# Patient Record
Sex: Male | Born: 1975 | Race: Black or African American | Hispanic: No | Marital: Single | Smoking: Current some day smoker
Health system: Southern US, Community
[De-identification: ages and names within clinical notes are randomized; demographics above are authoritative.]

## PROBLEM LIST (undated history)

## (undated) DIAGNOSIS — R002 Palpitations: Secondary | ICD-10-CM

## (undated) DIAGNOSIS — I471 Supraventricular tachycardia: Secondary | ICD-10-CM

## (undated) DIAGNOSIS — F419 Anxiety disorder, unspecified: Secondary | ICD-10-CM

## (undated) HISTORY — DX: Anxiety disorder, unspecified: F41.9

---

## 1999-12-12 HISTORY — PX: FRACTURE SURGERY: SHX138

## 1999-12-12 HISTORY — PX: BONY PELVIS SURGERY: SHX572

## 2003-12-15 ENCOUNTER — Emergency Department (HOSPITAL_COMMUNITY): Admission: EM | Admit: 2003-12-15 | Discharge: 2003-12-15 | Payer: Self-pay | Admitting: *Deleted

## 2004-08-08 ENCOUNTER — Emergency Department (HOSPITAL_COMMUNITY): Admission: EM | Admit: 2004-08-08 | Discharge: 2004-08-08 | Payer: Self-pay | Admitting: Emergency Medicine

## 2006-05-04 ENCOUNTER — Emergency Department (HOSPITAL_COMMUNITY): Admission: EM | Admit: 2006-05-04 | Discharge: 2006-05-05 | Payer: Self-pay | Admitting: Emergency Medicine

## 2011-01-13 ENCOUNTER — Inpatient Hospital Stay (INDEPENDENT_AMBULATORY_CARE_PROVIDER_SITE_OTHER)
Admission: RE | Admit: 2011-01-13 | Discharge: 2011-01-13 | Disposition: A | Discharge status: Home / Self Care | Payer: Self-pay | Source: Ambulatory Visit | Attending: Family Medicine | Admitting: Family Medicine

## 2011-01-13 DIAGNOSIS — M7989 Other specified soft tissue disorders: Secondary | ICD-10-CM

## 2011-01-13 LAB — POCT URINALYSIS DIPSTICK
Ketones, ur: NEGATIVE mg/dL
Urine Glucose, Fasting: NEGATIVE mg/dL

## 2011-01-20 ENCOUNTER — Encounter: Payer: Self-pay | Admitting: Family Medicine

## 2011-01-20 ENCOUNTER — Ambulatory Visit (INDEPENDENT_AMBULATORY_CARE_PROVIDER_SITE_OTHER): Payer: 59 | Admitting: Family Medicine

## 2011-01-20 VITALS — BP 121/77 | HR 86 | Temp 98.5°F | Ht 67.0 in | Wt 176.0 lb

## 2011-01-20 DIAGNOSIS — Z0000 Encounter for general adult medical examination without abnormal findings: Secondary | ICD-10-CM

## 2011-01-20 DIAGNOSIS — F17200 Nicotine dependence, unspecified, uncomplicated: Secondary | ICD-10-CM

## 2011-01-20 NOTE — Assessment & Plan Note (Addendum)
On day 2 of chantix. Encouraged use and instructed pt to cal [place] with any concerns or if any other additional were needed for smoking cessation. Will followup in 3-6 months as to resolution of smoking.  Pt agreeable to plan.

## 2011-01-22 ENCOUNTER — Encounter: Payer: Self-pay | Admitting: Family Medicine

## 2011-01-22 DIAGNOSIS — Z0000 Encounter for general adult medical examination without abnormal findings: Secondary | ICD-10-CM | POA: Insufficient documentation

## 2011-01-22 NOTE — Assessment & Plan Note (Signed)
Otherwise normal annual physical exam. Will follow-up in 1 year. Will see back in 3-6 months about smoking.

## 2011-01-22 NOTE — Progress Notes (Signed)
  Subjective:    Patient ID: Gary Suarez, male    DOB: [birth year], 35 y.o.   MRN: [id number]  HPI  Pt here for PCP set up. Pt denies any acute issues.   Review of Systems  Constitutional: Negative.   Respiratory: Negative.   Cardiovascular: Negative.   Genitourinary: Negative.    Social Hx: Single. Involved in monogamous relationship. Works at [employer] at [employer]. (Couldnt fully insert social history into epic).     Objective:   Physical Exam  Vitals reviewed. Constitutional: He is oriented to person, place, and time. He appears well-developed and well-nourished.  HENT:  Head: Normocephalic and atraumatic.  Eyes: EOM are normal. Pupils are equal, round, and reactive to light.  Neck: Normal range of motion. Neck supple.  Cardiovascular: Normal rate, regular rhythm and normal heart sounds.   Pulmonary/Chest: Effort normal and breath sounds normal. He has no wheezes.  Abdominal: Soft. Bowel sounds are normal.  Musculoskeletal: Normal range of motion.  Neurological: He is alert and oriented to person, place, and time. He has normal reflexes.  Skin: Skin is warm and dry.  Psychiatric: He has a normal mood and affect. His behavior is normal.          Assessment & Plan:

## 2011-03-22 ENCOUNTER — Inpatient Hospital Stay (INDEPENDENT_AMBULATORY_CARE_PROVIDER_SITE_OTHER)
Admission: RE | Admit: 2011-03-22 | Discharge: 2011-03-22 | Disposition: A | Discharge status: Home / Self Care | Payer: 59 | Source: Ambulatory Visit | Attending: Emergency Medicine | Admitting: Emergency Medicine

## 2011-03-22 DIAGNOSIS — B350 Tinea barbae and tinea capitis: Secondary | ICD-10-CM

## 2014-01-01 ENCOUNTER — Encounter (HOSPITAL_COMMUNITY): Payer: Self-pay | Admitting: Emergency Medicine

## 2014-01-01 ENCOUNTER — Emergency Department (HOSPITAL_COMMUNITY)
Admission: EM | Admit: 2014-01-01 | Discharge: 2014-01-02 | Disposition: A | Discharge status: Home / Self Care | Payer: BC Managed Care – PPO | Attending: Emergency Medicine | Admitting: Emergency Medicine

## 2014-01-01 DIAGNOSIS — F17200 Nicotine dependence, unspecified, uncomplicated: Secondary | ICD-10-CM | POA: Insufficient documentation

## 2014-01-01 DIAGNOSIS — Z87828 Personal history of other (healed) physical injury and trauma: Secondary | ICD-10-CM | POA: Insufficient documentation

## 2014-01-01 DIAGNOSIS — I471 Supraventricular tachycardia: Secondary | ICD-10-CM

## 2014-01-01 DIAGNOSIS — Z79899 Other long term (current) drug therapy: Secondary | ICD-10-CM | POA: Insufficient documentation

## 2014-01-01 DIAGNOSIS — I498 Other specified cardiac arrhythmias: Secondary | ICD-10-CM | POA: Insufficient documentation

## 2014-01-01 DIAGNOSIS — Z8659 Personal history of other mental and behavioral disorders: Secondary | ICD-10-CM | POA: Insufficient documentation

## 2014-01-01 LAB — CBC WITH DIFFERENTIAL/PLATELET
BASOS ABS: 0 10*3/uL (ref 0.0–0.1)
Basophils Relative: 0 % (ref 0–1)
EOS PCT: 1 % (ref 0–5)
Eosinophils Absolute: 0.1 10*3/uL (ref 0.0–0.7)
HCT: 44 % (ref 39.0–52.0)
Hemoglobin: 15.5 g/dL (ref 13.0–17.0)
LYMPHS ABS: 4.5 10*3/uL — AB (ref 0.7–4.0)
LYMPHS PCT: 51 % — AB (ref 12–46)
MCH: 29.9 pg (ref 26.0–34.0)
MCHC: 35.2 g/dL (ref 30.0–36.0)
MCV: 84.8 fL (ref 78.0–100.0)
Monocytes Absolute: 0.8 10*3/uL (ref 0.1–1.0)
Monocytes Relative: 9 % (ref 3–12)
Neutro Abs: 3.4 10*3/uL (ref 1.7–7.7)
Neutrophils Relative %: 39 % — ABNORMAL LOW (ref 43–77)
Platelets: 217 10*3/uL (ref 150–400)
RBC: 5.19 MIL/uL (ref 4.22–5.81)
RDW: 13.6 % (ref 11.5–15.5)
WBC: 8.8 10*3/uL (ref 4.0–10.5)

## 2014-01-01 LAB — BASIC METABOLIC PANEL
BUN: 18 mg/dL (ref 6–23)
CO2: 19 meq/L (ref 19–32)
Calcium: 8.9 mg/dL (ref 8.4–10.5)
Chloride: 96 mEq/L (ref 96–112)
Creatinine, Ser: 1.11 mg/dL (ref 0.50–1.35)
GFR calc Af Amer: >90 mL/min (ref >90)
GFR calc non Af Amer: 84 mL/min — ABNORMAL LOW (ref >90)
Glucose, Bld: 133 mg/dL — ABNORMAL HIGH (ref 70–99)
POTASSIUM: 3.4 meq/L — AB (ref 3.7–5.3)
SODIUM: 135 meq/L — AB (ref 137–147)

## 2014-01-01 MED ORDER — ADENOSINE 6 MG/2ML IV SOLN
6.0000 mg | Freq: Once | INTRAVENOUS | Status: AC
  Administered 2014-01-01: 6 mg via INTRAVENOUS

## 2014-01-01 MED ORDER — SODIUM CHLORIDE 0.9 % IV BOLUS (SEPSIS)
1000.0000 mL | Freq: Once | INTRAVENOUS | Status: AC
  Administered 2014-01-01: 1000 mL via INTRAVENOUS

## 2014-01-01 MED ORDER — ADENOSINE 6 MG/2ML IV SOLN
INTRAVENOUS | Status: AC
  Filled 2014-01-01: qty 6
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
## 2014-01-01 NOTE — ED Notes (Signed)
Patient took Herbalite, has been taking it since Sunday.  Patient with palpitations at this time.  Patient did have alcohol last night.  Patient with HR of 207.

## 2014-01-01 NOTE — ED Notes (Addendum)
Pt brought back to room from triage. Placed on pacing pads and on EKG monitoring. Dr. Bernette MayersSheldon, Elliot GurneyWoody RN, Minerva AreolaEric RN and this RN at bedside. This Rn attempted IV access x 2 without success. Woody Rn placed 18G in Right AC. Pt HR 200's, able to speak full sentences, breathing unlabored. Pt given 6mg  of adenosine by Elliot GurneyWoody RN and Minerva AreolaEric RN flushed at top port immediately after, and arm lifted. HR decreased slowly and staying in 110-120bpm, pt converted to sinus rhythm. Pt sts feels better. Denies palpitations at this time. Tyrone Phlebotomy sticking pt at bedside.    Pt started Herbal life on Sunday, drinks appx.3-4 Sodas a day and a few beers.

## 2014-01-02 NOTE — Discharge Instructions (Signed)
Supraventricular Tachycardia °Supraventricular tachycardia (SVT) is an abnormal heart rhythm (arrhythmia) that causes the heart to beat very fast (tachycardia). This kind of fast heartbeat originates in the upper chambers of the heart (atria). SVT can cause the heart to beat greater than 100 beats per minute. SVT can have a rapid burst of heartbeats. This can start and stop suddenly without warning and is called nonsustained. SVT can also be sustained, in which the heart beats at a continuous fast rate.  °CAUSES  °There can be different causes of SVT. Some of these include: °· Heart valve problems such as mitral valve prolapse. °· An enlarged heart (hypertrophic cardiomyopathy). °· Congenital heart problems. °· Heart inflammation (pericarditis). °· Hyperthyroidism. °· Low potassium or magnesium levels. °· Caffeine. °· Drug use such as cocaine, methamphetamines, or stimulants. °· Some over-the-counter medicines such as: °· Decongestants. °· Diet medicines. °· Herbal medicines. °SYMPTOMS  °Symptoms of SVT can vary. Symptoms depend on whether the SVT is sustained or nonsustained. You may experience: °· No symptoms (asymptomatic). °· An awareness of your heart beating rapidly (palpitations). °· Shortness of breath. °· Chest pain or pressure. °If your blood pressure drops because of the SVT, you may experience: °· Fainting or near fainting. °· Weakness. °· Dizziness. °DIAGNOSIS  °Different tests can be performed to diagnose SVT, such as: °· An electrocardiogram (EKG). This is a painless test that records the electrical activity of your heart. °· Holter monitor. This is a 24 hour recording of your heart rhythm. You will be given a diary. Write down all symptoms that you have and what you were doing at the time you experienced symptoms. °· Arrhythmia monitor. This is a small device that your wear for several weeks. It records the heart rhythm when you have symptoms. °· Echocardiogram. This is an imaging test to help detect  abnormal heart structure such as congenital abnormalities, heart valve problems, or heart enlargement. °· Stress test. This test can help determine if the SVT is related to exercise. °· Electrophysiology study (EPS). This is a procedure that evaluates your heart's electrical system and can help your caregiver find the cause of your SVT. °TREATMENT  °Treatment of SVT depends on the symptoms, how often it recurs, and whether there are any underlying heart problems.  °· If symptoms are rare and no other cardiac disease is present, no treatment may be needed. °· Blood work may be done to check potassium, magnesium, and thyroid hormone levels to see if they are abnormal. If these levels are abnormal, treatment to correct the problems will occur. °Medicines °Your caregiver may use oral medicines to treat SVT. These medicines are given for long-term control of SVT. Medicines may be used alone or in combination with other treatments. These medicines work to slow nerve impulses in the heart muscle. These medicines can also be used to treat high blood pressure. Some of these medicines may include: °· Calcium channel blockers. °· Beta blockers. °· Digoxin. °Nonsurgical procedures °Nonsurgical techniques may be used if oral medicines do not work. Some examples include: °· Cardioversion. This technique uses either drugs or an electrical shock to restore a normal heart rhythm. °· Cardioversion drugs may be given through an intravenous (IV) line to help "reset" the heart rhythm. °· In electrical cardioversion, the caregiver shocks your heart to stop its beat for a split second. This helps to reset the heart to a normal rhythm. °· Ablation. This procedure is done under mild sedation. High frequency radio wave energy is used to   destroy the area of heart tissue responsible for the SVT. °HOME CARE INSTRUCTIONS  °· Do not smoke. °· Only take medicines prescribed by your caregiver. Check with your caregiver before using over-the-counter  medicines. °· Check with your caregiver about how much alcohol and caffeine (coffee, tea, colas, or chocolate) you may have. °· It is very important to keep all follow-up referrals and appointments in order to properly manage this problem. °SEEK IMMEDIATE MEDICAL CARE IF: °· You have dizziness. °· You faint or nearly faint. °· You have shortness of breath. °· You have chest pain or pressure. °· You have sudden nausea or vomiting. °· You have profuse sweating. °· You are concerned about how long your symptoms last. °· You are concerned about the frequency of your SVT episodes. °If you have the above symptoms, call your local emergency services (911 in [place] immediately. Do not drive yourself to the hospital. °MAKE SURE YOU:  °· Understand these instructions. °· Will watch your condition. °· Will get help right away if you are not doing well or get worse. °Document Released: 11/27/2005 Document Revised: 02/19/2012 Document Reviewed: 03/11/2009 °ExitCare® Patient Information ©2014 [employer]. ° °

## 2014-01-15 ENCOUNTER — Emergency Department (HOSPITAL_COMMUNITY)
Admission: EM | Admit: 2014-01-15 | Discharge: 2014-01-15 | Disposition: A | Discharge status: Home / Self Care | Payer: BC Managed Care – PPO | Attending: Emergency Medicine | Admitting: Emergency Medicine

## 2014-01-15 ENCOUNTER — Emergency Department (HOSPITAL_COMMUNITY): Payer: BC Managed Care – PPO

## 2014-01-15 ENCOUNTER — Encounter (HOSPITAL_COMMUNITY): Payer: Self-pay | Admitting: Emergency Medicine

## 2014-01-15 DIAGNOSIS — R002 Palpitations: Secondary | ICD-10-CM

## 2014-01-15 DIAGNOSIS — R0602 Shortness of breath: Secondary | ICD-10-CM | POA: Insufficient documentation

## 2014-01-15 DIAGNOSIS — R000 Tachycardia, unspecified: Secondary | ICD-10-CM

## 2014-01-15 DIAGNOSIS — Z8659 Personal history of other mental and behavioral disorders: Secondary | ICD-10-CM | POA: Insufficient documentation

## 2014-01-15 DIAGNOSIS — I498 Other specified cardiac arrhythmias: Secondary | ICD-10-CM | POA: Insufficient documentation

## 2014-01-15 DIAGNOSIS — F17200 Nicotine dependence, unspecified, uncomplicated: Secondary | ICD-10-CM | POA: Insufficient documentation

## 2014-01-15 LAB — BASIC METABOLIC PANEL
BUN: 14 mg/dL (ref 6–23)
CALCIUM: 9.3 mg/dL (ref 8.4–10.5)
CO2: 20 meq/L (ref 19–32)
Chloride: 103 mEq/L (ref 96–112)
Creatinine, Ser: 1.03 mg/dL (ref 0.50–1.35)
GLUCOSE: 101 mg/dL — AB (ref 70–99)
Potassium: 3.8 mEq/L (ref 3.7–5.3)
SODIUM: 141 meq/L (ref 137–147)

## 2014-01-15 LAB — PRO B NATRIURETIC PEPTIDE: Pro B Natriuretic peptide (BNP): 9.5 pg/mL (ref 0–125)

## 2014-01-15 LAB — CBC
HCT: 43.4 % (ref 39.0–52.0)
HEMOGLOBIN: 14.9 g/dL (ref 13.0–17.0)
MCH: 29.4 pg (ref 26.0–34.0)
MCHC: 34.3 g/dL (ref 30.0–36.0)
MCV: 85.6 fL (ref 78.0–100.0)
PLATELETS: 233 10*3/uL (ref 150–400)
RBC: 5.07 MIL/uL (ref 4.22–5.81)
RDW: 14.1 % (ref 11.5–15.5)
WBC: 4.9 10*3/uL (ref 4.0–10.5)

## 2014-01-15 LAB — POCT I-STAT TROPONIN I: Troponin i, poc: 0.01 ng/mL (ref 0.00–0.08)

## 2014-01-15 MED ORDER — SODIUM CHLORIDE 0.9 % IV BOLUS (SEPSIS)
1000.0000 mL | Freq: Once | INTRAVENOUS | Status: AC
  Administered 2014-01-15: 1000 mL via INTRAVENOUS

## 2014-01-15 NOTE — Discharge Instructions (Signed)
Follow up with your PCP.   Palpitations  A palpitation is the feeling that your heartbeat is irregular or is faster than normal. It may feel like your heart is fluttering or skipping a beat. Palpitations are usually not a serious problem. However, in some cases, you may need further medical evaluation. CAUSES  Palpitations can be caused by:  Smoking.  Caffeine or other stimulants, such as diet pills or energy drinks.  Alcohol.  Stress and anxiety.  Strenuous physical activity.  Fatigue.  Certain medicines.  Heart disease, especially if you have a history of arrhythmias. This includes atrial fibrillation, atrial flutter, or supraventricular tachycardia.  An improperly working pacemaker or defibrillator. DIAGNOSIS  To find the cause of your palpitations, your caregiver will take your history and perform a physical exam. Tests may also be done, including:  Electrocardiography (ECG). This test records the heart's electrical activity.  Cardiac monitoring. This allows your caregiver to monitor your heart rate and rhythm in real time.  Holter monitor. This is a portable device that records your heartbeat and can help diagnose heart arrhythmias. It allows your caregiver to track your heart activity for several days, if needed.  Stress tests by exercise or by giving medicine that makes the heart beat faster. TREATMENT  Treatment of palpitations depends on the cause of your symptoms and can vary greatly. Most cases of palpitations do not require any treatment other than time, relaxation, and monitoring your symptoms. Other causes, such as atrial fibrillation, atrial flutter, or supraventricular tachycardia, usually require further treatment. HOME CARE INSTRUCTIONS   Avoid:  Caffeinated coffee, tea, soft drinks, diet pills, and energy drinks.  Chocolate.  Alcohol.  Stop smoking if you smoke.  Reduce your stress and anxiety. Things that can help you relax include:  A method that  measures bodily functions so you can learn to control them (biofeedback).  Yoga.  Meditation.  Physical activity such as swimming, jogging, or walking.  Get plenty of rest and sleep. SEEK MEDICAL CARE IF:   You continue to have a fast or irregular heartbeat beyond 24 hours.  Your palpitations occur more often. SEEK IMMEDIATE MEDICAL CARE IF:  You develop chest pain or shortness of breath.  You have a severe headache.  You feel dizzy, or you faint. MAKE SURE YOU:  Understand these instructions.  Will watch your condition.  Will get help right away if you are not doing well or get worse. Document Released: 11/24/2000 Document Revised: 03/24/2013 Document Reviewed: 01/26/2012 [facility] Patient Information 2014 [place]Care, [place].

## 2014-01-15 NOTE — ED Notes (Signed)
Pt reports having palpitations and feels like heart is racing with mild sob. Was here on 1/22 for same but HR was 200, pt given meds and dc home. Pt appears in no acute distress today, airway is intact, HR 130.
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
## 2014-01-16 ENCOUNTER — Other Ambulatory Visit (HOSPITAL_COMMUNITY)
Admission: RE | Admit: 2014-01-16 | Discharge: 2014-01-16 | Disposition: A | Discharge status: Home / Self Care | Payer: BC Managed Care – PPO | Source: Ambulatory Visit | Attending: Emergency Medicine | Admitting: Emergency Medicine

## 2014-01-16 ENCOUNTER — Emergency Department (HOSPITAL_COMMUNITY)
Admission: EM | Admit: 2014-01-16 | Discharge: 2014-01-16 | Disposition: A | Discharge status: Home / Self Care | Payer: BC Managed Care – PPO | Source: Home / Self Care

## 2014-01-16 ENCOUNTER — Encounter (HOSPITAL_COMMUNITY): Payer: Self-pay | Admitting: Emergency Medicine

## 2014-01-16 DIAGNOSIS — R300 Dysuria: Secondary | ICD-10-CM

## 2014-01-16 DIAGNOSIS — Z113 Encounter for screening for infections with a predominantly sexual mode of transmission: Secondary | ICD-10-CM | POA: Insufficient documentation

## 2014-01-16 MED ORDER — AZITHROMYCIN 250 MG PO TABS
1000.0000 mg | ORAL_TABLET | Freq: Every day | ORAL | Status: DC
  Administered 2014-01-16: 1000 mg via ORAL

## 2014-01-16 MED ORDER — CEFTRIAXONE SODIUM 250 MG IJ SOLR
250.0000 mg | Freq: Once | INTRAMUSCULAR | Status: AC
  Administered 2014-01-16: 250 mg via INTRAMUSCULAR

## 2014-01-16 MED ORDER — CEFTRIAXONE SODIUM 250 MG IJ SOLR
INTRAMUSCULAR | Status: AC
  Filled 2014-01-16: qty 250

## 2014-01-16 MED ORDER — AZITHROMYCIN 250 MG PO TABS
ORAL_TABLET | ORAL | Status: AC
  Filled 2014-01-16: qty 4
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
## 2014-01-16 NOTE — ED Notes (Signed)
Pt     Reports    He    Has  Symptoms  Of  Burning   Slightly on  Urination    Recently  -  Pt  denys  Any      Penile  Discharge        Pt  Reports  Had  Unprotected  Oral  Sex   sev  Weeks  Ago

## 2014-01-16 NOTE — ED Provider Notes (Signed)
Medical screening examination/treatment/procedure(s) were performed by non-physician practitioner and as supervising physician I was immediately available for consultation/collaboration.  Twan Harkin, M.D.   Biddie Sebek C Avraj Lindroth, MD 01/16/14 1924 

## 2014-01-16 NOTE — ED Provider Notes (Signed)
I saw and evaluated the patient, reviewed the resident's note and I agree with the findings and plan.  EKG Interpretation    Date/Time:  Thursday January 15 2014 15:42:21 EST Ventricular Rate:  129 PR Interval:  164 QRS Duration: 92 QT Interval:  288 QTC Calculation: 421 R Axis:   82 Text Interpretation:  Sinus tachycardia Possible Left atrial enlargement Borderline ECG No significant change since last tracing Confirmed by Elenora Hawbaker  MD, Toni AmendOURTNEY ([id number]) on 01/15/2014 6:03:34 PM            Patient presents with palpitations.  Found initially to have a heartrate in the 130's.  Patient had a similar presentation several weeks ago and tachycardia thought to be secondary to diet supplement.  Patient has discontinued supplement.  NOntoxic on exam.  HR is in the 90's.  Appears sinus on EKG.  Now asymptomatic.  Patient fluid responsive.  W/U unremarkable.  After history, exam, and medical workup I feel the patient has been appropriately medically screened and is safe for discharge home. Pertinent diagnoses were discussed with the patient. Patient was given return precautions.   Results for orders placed during the hospital encounter of 01/15/14  CBC      Result Value Range   WBC 4.9  4.0 - 10.5 K/uL   RBC 5.07  4.22 - 5.81 MIL/uL   Hemoglobin 14.9  13.0 - 17.0 g/dL   HCT 13.043.4  86.539.0 - 78.452.0 %   MCV 85.6  78.0 - 100.0 fL   MCH 29.4  26.0 - 34.0 pg   MCHC 34.3  30.0 - 36.0 g/dL   RDW 69.614.1  29.511.5 - 28.415.5 %   Platelets 233  150 - 400 K/uL  BASIC METABOLIC PANEL      Result Value Range   Sodium 141  137 - 147 mEq/L   Potassium 3.8  3.7 - 5.3 mEq/L   Chloride 103  96 - 112 mEq/L   CO2 20  19 - 32 mEq/L   Glucose, Bld 101 (*) 70 - 99 mg/dL   BUN 14  6 - 23 mg/dL   Creatinine, Ser 1.321.03  0.50 - 1.35 mg/dL   Calcium 9.3  8.4 - 44.010.5 mg/dL   GFR calc non Af Amer >90  >90 mL/min   GFR calc Af Amer >90  >90 mL/min  PRO B NATRIURETIC PEPTIDE      Result Value Range   Pro B Natriuretic peptide  (BNP) 9.5  0 - 125 pg/mL  POCT I-STAT TROPONIN I      Result Value Range   Troponin i, poc 0.01  0.00 - 0.08 ng/mL   Comment 3            Dg Chest 2 View  01/15/2014   CLINICAL DATA:  Shortness of breath  EXAM: CHEST  2 VIEW  COMPARISON:  None.  FINDINGS: The heart size and mediastinal contours are within normal limits. Both lungs are clear. The visualized skeletal structures are unremarkable.  IMPRESSION: No active cardiopulmonary disease.   Electronically Signed   By: Alcide CleverMark  Lukens M.D.   On: 01/15/2014 16:19      Shon Batonourtney F Caramia Boutin, MD 01/16/14 [phone]

## 2014-01-16 NOTE — Discharge Instructions (Signed)

## 2014-01-20 ENCOUNTER — Telehealth: Payer: Self-pay | Admitting: *Deleted

## 2014-01-20 NOTE — Telephone Encounter (Signed)
LMVM for patient to please call and schedule hospital f/u appt.  Gary Suarez, Darlyne RussianKristen L, CMA

## 2014-01-22 ENCOUNTER — Ambulatory Visit (INDEPENDENT_AMBULATORY_CARE_PROVIDER_SITE_OTHER): Payer: BC Managed Care – PPO | Admitting: Emergency Medicine

## 2014-01-22 ENCOUNTER — Encounter: Payer: Self-pay | Admitting: Emergency Medicine

## 2014-01-22 VITALS — BP 129/89 | HR 84 | Ht 67.0 in | Wt 184.5 lb

## 2014-01-22 DIAGNOSIS — R002 Palpitations: Secondary | ICD-10-CM

## 2014-01-22 NOTE — Progress Notes (Signed)
   Subjective:    Patient ID: Gary Suarez, male    DOB: 1976-03-19, 38 y.o.   MRN: [id number]  HPI Gary Suarez is here for ed f/u for palpitations.  He has been seen in the ER twice for palpitations.  The first time he reports his heart rate as in the 200s.  The second time it was in the 130s.  He states that with the first episode he was taking herbalife (which has caffeine).  The second episode was triggered by caffeine combined with some alcohol.  He reports feeling great currently.  No recurrent palpitations.  No chest pain, dizziness or shortness of breath.  He is no longer taking herbalife and is avoiding caffeine and alcohol.    No current outpatient prescriptions on file prior to visit.   No current facility-administered medications on file prior to visit.    I have reviewed and updated the following as appropriate: allergies and current medications SHx: current smoker   Review of Systems See HPI    Objective:   Physical Exam BP 129/89  Pulse 84  Ht 5\' 7"  (1.702 m)  Wt 184 lb 8 oz (83.689 kg)  BMI 28.89 kg/m2 Gen: alert, cooperative, NAD CV: RRR, no murmurs     Assessment & Plan:

## 2014-01-22 NOTE — Patient Instructions (Signed)
It was nice to meet you!  Please avoid foods and drinks that have caffeine, chocolate, alcohol in the them. If your heart is beating fast you can valsalva (like straining to have a bowel movement) to try and slow it down.  If the palpitations are lasting more than 30-40 minutes, or you have chest pain, shortness of breath or dizziness with them, please go the ER for evaluation.

## 2014-01-22 NOTE — Assessment & Plan Note (Signed)
Intermittent; likely secondary to caffeine and alcohol intake. Currently in a regular rate/rhythm. Discussed triggers extensively with patient. Reviewed red flags that warrant trip to ER as in AVS. Discussed valsalva maneuver. F/u for annual exam.

## 2014-05-01 ENCOUNTER — Emergency Department (HOSPITAL_COMMUNITY)
Admission: EM | Admit: 2014-05-01 | Discharge: 2014-05-01 | Disposition: A | Discharge status: Home / Self Care | Payer: BC Managed Care – PPO | Attending: Emergency Medicine | Admitting: Emergency Medicine

## 2014-05-01 ENCOUNTER — Encounter (HOSPITAL_COMMUNITY): Payer: Self-pay | Admitting: Emergency Medicine

## 2014-05-01 DIAGNOSIS — Z8659 Personal history of other mental and behavioral disorders: Secondary | ICD-10-CM | POA: Insufficient documentation

## 2014-05-01 DIAGNOSIS — H9201 Otalgia, right ear: Secondary | ICD-10-CM

## 2014-05-01 DIAGNOSIS — H9209 Otalgia, unspecified ear: Secondary | ICD-10-CM | POA: Insufficient documentation

## 2014-05-01 DIAGNOSIS — Z8781 Personal history of (healed) traumatic fracture: Secondary | ICD-10-CM | POA: Insufficient documentation

## 2014-05-01 DIAGNOSIS — F17200 Nicotine dependence, unspecified, uncomplicated: Secondary | ICD-10-CM | POA: Insufficient documentation

## 2014-05-01 MED ORDER — IBUPROFEN 800 MG PO TABS: 800.0000 mg | ORAL_TABLET | Freq: Three times a day (TID) | ORAL | Status: DC | PRN

## 2014-05-01 NOTE — ED Notes (Signed)
For one week pain in his right ear

## 2014-05-01 NOTE — Discharge Instructions (Signed)
Otalgia  Otalgia is pain in or around the ear. When the pain is from the ear itself it is called primary otalgia. Pain may also be coming from somewhere else, like the head and neck. This is called secondary otalgia.   CAUSES   Causes of primary otalgia include:  · Middle ear infection.  · It can also be caused by injury to the ear or infection of the ear canal (swimmer's ear). Swimmer's ear causes pain, swelling and often drainage from the ear canal.  Causes of secondary otalgia include:  · Sinus infections.  · Allergies and colds that cause stuffiness of the nose and tubes that drain the ears (eustachian tubes).  · Dental problems like cavities, gum infections or teething.  · Sore Throat (tonsillitis and pharyngitis).  · Swollen glands in the neck.  · Infection of the bone behind the ear (mastoiditis).  · TMJ discomfort (problems with the joint between your jaw and your skull).  · Other problems such as nerve disorders, circulation problems, heart disease and tumors of the head and neck can also cause symptoms of ear pain. This is rare.  DIAGNOSIS   Evaluation, Diagnosis and Testing:  · Examination by your medical caregiver is recommended to evaluate and diagnose the cause of otalgia.  · Further testing or referral to a specialist may be indicated if the cause of the ear pain is not found and the symptom persists.  TREATMENT   · Your doctor may prescribe antibiotics if an ear infection is diagnosed.  · Pain relievers and topical analgesics may be recommended.  · It is important to take all medications as prescribed.  HOME CARE INSTRUCTIONS   · It may be helpful to sleep with the painful ear in the up position.  · A warm compress over the painful ear may provide relief.  · A soft diet and avoiding gum may help while ear pain is present.  SEEK IMMEDIATE MEDICAL CARE IF:  · You develop severe pain, a high fever, repeated vomiting or dehydration.  · You develop extreme dizziness, headache, confusion, ringing in the  ears (tinnitus) or hearing loss.  Document Released: 01/04/2005 Document Revised: 02/19/2012 Document Reviewed: 10/06/2009  [employer]® Patient Information ©2014 [employer].
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
## 2014-05-02 NOTE — ED Provider Notes (Signed)
Medical screening examination/treatment/procedure(s) were performed by non-physician practitioner and as supervising physician I was immediately available for consultation/collaboration.   EKG Interpretation None        Xzaviar Maloof S Nakira Litzau, MD 05/02/14 1228 

## 2014-08-15 ENCOUNTER — Emergency Department (HOSPITAL_COMMUNITY)
Admission: EM | Admit: 2014-08-15 | Discharge: 2014-08-16 | Disposition: A | Discharge status: Home / Self Care | Payer: BC Managed Care – PPO | Attending: Emergency Medicine | Admitting: Emergency Medicine

## 2014-08-15 ENCOUNTER — Encounter (HOSPITAL_COMMUNITY): Payer: Self-pay | Admitting: Emergency Medicine

## 2014-08-15 DIAGNOSIS — Z8659 Personal history of other mental and behavioral disorders: Secondary | ICD-10-CM | POA: Diagnosis not present

## 2014-08-15 DIAGNOSIS — Z791 Long term (current) use of non-steroidal anti-inflammatories (NSAID): Secondary | ICD-10-CM | POA: Insufficient documentation

## 2014-08-15 DIAGNOSIS — F17200 Nicotine dependence, unspecified, uncomplicated: Secondary | ICD-10-CM | POA: Insufficient documentation

## 2014-08-15 DIAGNOSIS — R002 Palpitations: Secondary | ICD-10-CM | POA: Diagnosis not present

## 2014-08-15 DIAGNOSIS — Z8679 Personal history of other diseases of the circulatory system: Secondary | ICD-10-CM | POA: Insufficient documentation

## 2014-08-15 HISTORY — DX: Supraventricular tachycardia: I47.1

## 2014-08-15 HISTORY — DX: Palpitations: R00.2

## 2014-08-15 LAB — COMPREHENSIVE METABOLIC PANEL
ALT: 25 U/L (ref 0–53)
AST: 21 U/L (ref 0–37)
Albumin: 4.4 g/dL (ref 3.5–5.2)
Alkaline Phosphatase: 49 U/L (ref 39–117)
Anion gap: 16 — ABNORMAL HIGH (ref 5–15)
BILIRUBIN TOTAL: 0.3 mg/dL (ref 0.3–1.2)
BUN: 11 mg/dL (ref 6–23)
CALCIUM: 9.2 mg/dL (ref 8.4–10.5)
CHLORIDE: 96 meq/L (ref 96–112)
CO2: 24 mEq/L (ref 19–32)
CREATININE: 0.96 mg/dL (ref 0.50–1.35)
GLUCOSE: 106 mg/dL — AB (ref 70–99)
Potassium: 3.5 mEq/L — ABNORMAL LOW (ref 3.7–5.3)
Sodium: 136 mEq/L — ABNORMAL LOW (ref 137–147)
Total Protein: 8 g/dL (ref 6.0–8.3)

## 2014-08-15 LAB — CBC WITH DIFFERENTIAL/PLATELET
BASOS ABS: 0 10*3/uL (ref 0.0–0.1)
Basophils Relative: 0 % (ref 0–1)
EOS PCT: 0 % (ref 0–5)
Eosinophils Absolute: 0 10*3/uL (ref 0.0–0.7)
HEMATOCRIT: 44.7 % (ref 39.0–52.0)
Hemoglobin: 15.9 g/dL (ref 13.0–17.0)
LYMPHS ABS: 1.9 10*3/uL (ref 0.7–4.0)
Lymphocytes Relative: 35 % (ref 12–46)
MCH: 29.1 pg (ref 26.0–34.0)
MCHC: 35.6 g/dL (ref 30.0–36.0)
MCV: 81.9 fL (ref 78.0–100.0)
MONOS PCT: 7 % (ref 3–12)
Monocytes Absolute: 0.4 10*3/uL (ref 0.1–1.0)
NEUTROS ABS: 3.1 10*3/uL (ref 1.7–7.7)
Neutrophils Relative %: 58 % (ref 43–77)
Platelets: 205 10*3/uL (ref 150–400)
RBC: 5.46 MIL/uL (ref 4.22–5.81)
RDW: 13.3 % (ref 11.5–15.5)
WBC: 5.4 10*3/uL (ref 4.0–10.5)

## 2014-08-15 LAB — I-STAT TROPONIN, ED: TROPONIN I, POC: 0 ng/mL (ref 0.00–0.08)

## 2014-08-15 MED ORDER — SODIUM CHLORIDE 0.9 % IV BOLUS (SEPSIS)
1000.0000 mL | Freq: Once | INTRAVENOUS | Status: AC
  Administered 2014-08-15: 1000 mL via INTRAVENOUS

## 2014-08-15 NOTE — ED Notes (Addendum)
Pt. reports palpitations /" heart racing "with mild SOB onset this evening , denies chest pain /nausea or diaphoresis . Heart rate = upper 90's at arrival .
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
## 2014-08-16 NOTE — ED Notes (Signed)
Discharge instructions reviewed with pt. Pt verbalized understanding.   

## 2014-08-16 NOTE — Discharge Instructions (Signed)
1. Medications: usual home medications 2. Treatment: rest, drink plenty of fluids,  3. Follow Up: Please followup with your primary doctor in 3 days for discussion of your diagnoses and further evaluation after today's visit;    Palpitations A palpitation is the feeling that your heartbeat is irregular or is faster than normal. It may feel like your heart is fluttering or skipping a beat. Palpitations are usually not a serious problem. However, in some cases, you may need further medical evaluation. CAUSES  Palpitations can be caused by:  Smoking.  Caffeine or other stimulants, such as diet pills or energy drinks.  Alcohol.  Stress and anxiety.  Strenuous physical activity.  Fatigue.  Certain medicines.  Heart disease, especially if you have a history of irregular heart rhythms (arrhythmias), such as atrial fibrillation, atrial flutter, or supraventricular tachycardia.  An improperly working pacemaker or defibrillator. DIAGNOSIS  To find the cause of your palpitations, your health care provider will take your medical history and perform a physical exam. Your health care provider may also have you take a test called an ambulatory electrocardiogram (ECG). An ECG records your heartbeat patterns over a 24-hour period. You may also have other tests, such as:  Transthoracic echocardiogram (TTE). During echocardiography, sound waves are used to evaluate how blood flows through your heart.  Transesophageal echocardiogram (TEE).  Cardiac monitoring. This allows your health care provider to monitor your heart rate and rhythm in real time.  Holter monitor. This is a portable device that records your heartbeat and can help diagnose heart arrhythmias. It allows your health care provider to track your heart activity for several days, if needed.  Stress tests by exercise or by giving medicine that makes the heart beat faster. TREATMENT  Treatment of palpitations depends on the cause of your  symptoms and can vary greatly. Most cases of palpitations do not require any treatment other than time, relaxation, and monitoring your symptoms. Other causes, such as atrial fibrillation, atrial flutter, or supraventricular tachycardia, usually require further treatment. HOME CARE INSTRUCTIONS   Avoid:  Caffeinated coffee, tea, soft drinks, diet pills, and energy drinks.  Chocolate.  Alcohol.  Stop smoking if you smoke.  Reduce your stress and anxiety. Things that can help you relax include:  A method of controlling things in your body, such as your heartbeats, with your mind (biofeedback).  Yoga.  Meditation.  Physical activity such as swimming, jogging, or walking.  Get plenty of rest and sleep. SEEK MEDICAL CARE IF:   You continue to have a fast or irregular heartbeat beyond 24 hours.  Your palpitations occur more often. SEEK IMMEDIATE MEDICAL CARE IF:  You have chest pain or shortness of breath.  You have a severe headache.  You feel dizzy or you faint. MAKE SURE YOU:  Understand these instructions.  Will watch your condition.  Will get help right away if you are not doing well or get worse. Document Released: 11/24/2000 Document Revised: 12/02/2013 Document Reviewed: 01/26/2012 [facility] Patient Information 2015 [place], [place]. This information is not intended to replace advice given to you by your health care provider. Make sure you discuss any questions you have with your health care provider.

## 2014-08-16 NOTE — ED Provider Notes (Signed)
Medical screening examination/treatment/procedure(s) were conducted as a shared visit with non-physician practitioner(s) or resident and myself. I personally evaluated the patient during the encounter and agree with the findings.  I have personally reviewed any xrays and/ or EKG's with the provider and I agree with interpretation.  Patient with intermittent alcohol abuse, caffeine use, herbal tea use presents after palpitations. Patient denies heart history or blood clot history, no recent surgeries, leg swelling or active cancer. Patient denies blood in the stools. On exam patient has improved after fluids and time. Lungs clear, heart regular rate and rhythm no murmurs, no leg swelling or leg pain, well-appearing. Discussed likely from alcohol/caffeine/Tea use, if symptoms return patient will see primary Dr. and arrange Holter monitor.  Palpitations   Enid Skeens, MD 08/16/14 [phone]

## 2016-10-04 ENCOUNTER — Ambulatory Visit (INDEPENDENT_AMBULATORY_CARE_PROVIDER_SITE_OTHER): Payer: BLUE CROSS/BLUE SHIELD

## 2016-10-04 ENCOUNTER — Ambulatory Visit (HOSPITAL_COMMUNITY)
Admission: EM | Admit: 2016-10-04 | Discharge: 2016-10-04 | Disposition: A | Discharge status: Home / Self Care | Payer: BLUE CROSS/BLUE SHIELD | Attending: Physician Assistant | Admitting: Physician Assistant

## 2016-10-04 ENCOUNTER — Encounter (HOSPITAL_COMMUNITY): Payer: Self-pay | Admitting: Emergency Medicine

## 2016-10-04 DIAGNOSIS — S6992XA Unspecified injury of left wrist, hand and finger(s), initial encounter: Secondary | ICD-10-CM | POA: Diagnosis not present
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
## 2016-10-04 NOTE — Discharge Instructions (Signed)
LIMIT ACTIVITY THAT CAN INJURE THUMB  WEAR SPLINT FOR COMFORT.

## 2016-10-04 NOTE — ED Triage Notes (Signed)
[facility] with a complaint of right finger pain x 1 week. The patient stated that he jammed his thumb on his left hand playing basketball.

## 2016-10-04 NOTE — ED Notes (Signed)
Large  l  Thumb  Spica  Applied
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
## 2018-12-09 DIAGNOSIS — Z13220 Encounter for screening for lipoid disorders: Secondary | ICD-10-CM | POA: Diagnosis not present

## 2018-12-09 DIAGNOSIS — Z0000 Encounter for general adult medical examination without abnormal findings: Secondary | ICD-10-CM | POA: Diagnosis not present

## 2018-12-09 DIAGNOSIS — F17210 Nicotine dependence, cigarettes, uncomplicated: Secondary | ICD-10-CM | POA: Diagnosis not present

## 2022-08-13 ENCOUNTER — Encounter (HOSPITAL_COMMUNITY): Payer: Self-pay | Admitting: Emergency Medicine

## 2022-08-13 ENCOUNTER — Emergency Department (HOSPITAL_COMMUNITY)
Admission: EM | Admit: 2022-08-13 | Discharge: 2022-08-13 | Disposition: A | Discharge status: Home / Self Care | Payer: 59 | Attending: Emergency Medicine | Admitting: Emergency Medicine

## 2022-08-13 ENCOUNTER — Emergency Department (HOSPITAL_COMMUNITY): Payer: 59

## 2022-08-13 ENCOUNTER — Other Ambulatory Visit: Payer: Self-pay

## 2022-08-13 DIAGNOSIS — R000 Tachycardia, unspecified: Secondary | ICD-10-CM | POA: Diagnosis not present

## 2022-08-13 DIAGNOSIS — D72829 Elevated white blood cell count, unspecified: Secondary | ICD-10-CM | POA: Insufficient documentation

## 2022-08-13 DIAGNOSIS — R509 Fever, unspecified: Secondary | ICD-10-CM | POA: Diagnosis not present

## 2022-08-13 DIAGNOSIS — Z20822 Contact with and (suspected) exposure to covid-19: Secondary | ICD-10-CM | POA: Diagnosis not present

## 2022-08-13 DIAGNOSIS — R002 Palpitations: Secondary | ICD-10-CM | POA: Diagnosis present

## 2022-08-13 LAB — BASIC METABOLIC PANEL
Anion gap: 13 (ref 5–15)
BUN: 14 mg/dL (ref 6–20)
CO2: 23 mmol/L (ref 22–32)
Calcium: 9.6 mg/dL (ref 8.9–10.3)
Chloride: 100 mmol/L (ref 98–111)
Creatinine, Ser: 1.23 mg/dL (ref 0.61–1.24)
GFR, Estimated: >60 mL/min (ref >60)
Glucose, Bld: 121 mg/dL — ABNORMAL HIGH (ref 70–99)
Potassium: 3.8 mmol/L (ref 3.5–5.1)
Sodium: 136 mmol/L (ref 135–145)

## 2022-08-13 LAB — RESP PANEL BY RT-PCR (FLU A&B, COVID) ARPGX2
Influenza A by PCR: NEGATIVE
Influenza B by PCR: NEGATIVE
SARS Coronavirus 2 by RT PCR: NEGATIVE

## 2022-08-13 LAB — CBC
HCT: 44.4 % (ref 39.0–52.0)
Hemoglobin: 14.8 g/dL (ref 13.0–17.0)
MCH: 28.8 pg (ref 26.0–34.0)
MCHC: 33.3 g/dL (ref 30.0–36.0)
MCV: 86.5 fL (ref 80.0–100.0)
Platelets: 228 10*3/uL (ref 150–400)
RBC: 5.13 MIL/uL (ref 4.22–5.81)
RDW: 13.2 % (ref 11.5–15.5)
WBC: 12.7 10*3/uL — ABNORMAL HIGH (ref 4.0–10.5)
nRBC: 0 % (ref 0.0–0.2)

## 2022-08-13 LAB — TROPONIN I (HIGH SENSITIVITY)
Troponin I (High Sensitivity): 7 ng/L (ref <18)
Troponin I (High Sensitivity): 6 ng/L (ref <18)

## 2022-08-13 LAB — TSH: TSH: 1.469 u[IU]/mL (ref 0.350–4.500)

## 2022-08-13 LAB — T4, FREE: Free T4: 0.56 ng/dL — ABNORMAL LOW (ref 0.61–1.12)

## 2022-08-13 MED ORDER — ACETAMINOPHEN 500 MG PO TABS
1000.0000 mg | ORAL_TABLET | Freq: Once | ORAL | Status: AC
  Administered 2022-08-13: 1000 mg via ORAL
  Filled 2022-08-13: qty 2

## 2022-08-13 MED ORDER — SODIUM CHLORIDE 0.9 % IV BOLUS
1000.0000 mL | Freq: Once | INTRAVENOUS | Status: AC
  Administered 2022-08-13: 1000 mL via INTRAVENOUS

## 2022-08-13 NOTE — Discharge Instructions (Signed)
Take tylenol 2 pills 4 times a day and motrin 4 pills 3 times a day.  Drink plenty of fluids.  Return for worsening shortness of breath, headache, confusion. Follow up with your family doctor.   

## 2022-08-13 NOTE — ED Triage Notes (Signed)
Pt reported to ED with c/o palpitations stating "his heart feels like it is racing". Has hx of the same.

## 2022-08-13 NOTE — ED Provider Notes (Signed)
[facility] EMERGENCY DEPARTMENT Provider Note   CSN: [id number] Arrival date & time: 08/13/22  0221     History  Chief Complaint  Patient presents with   Palpitations    Gary Suarez is a 46 y.o. male.  46 yo M with a chief complaints of palpitations.  Notes that this been going on this evening.  Started when he felt something got stuck in his teeth and that he felt like he choked on it.  He now feels much better.  Denies any chest pain or difficulty breathing.  Denies fevers or chills denies nausea or vomiting.  Denies recent travel denies sick contacts denies cough or congestion.  Denies abdominal pain denies urinary symptoms.   Palpitations      Home Medications Prior to Admission medications   Medication Sig Start Date End Date Taking? Authorizing Provider  naproxen sodium (ANAPROX) 220 MG tablet Take 220 mg by mouth daily as needed (for pain).    [provider]      Allergies    Percocet [oxycodone-acetaminophen]    Review of Systems   Review of Systems  Cardiovascular:  Positive for palpitations.    Physical Exam Updated Vital Signs BP (!) 146/97   Pulse 94   Temp (!) 100.4 F (38 C) (Oral)   Resp (!) 21   SpO2 99%  Physical Exam Vitals and nursing note reviewed.  Constitutional:      Appearance: He is well-developed.  HENT:     Head: Normocephalic and atraumatic.  Eyes:     Pupils: Pupils are equal, round, and reactive to light.  Neck:     Vascular: No JVD.  Cardiovascular:     Rate and Rhythm: Regular rhythm. Tachycardia present.     Heart sounds: No murmur heard.    No friction rub. No gallop.  Pulmonary:     Effort: No respiratory distress.     Breath sounds: No wheezing.  Abdominal:     General: There is no distension.     Tenderness: There is no abdominal tenderness. There is no guarding or rebound.  Musculoskeletal:        General: Normal range of motion.     Cervical back: Normal range of motion and neck  supple.  Skin:    Coloration: Skin is not pale.     Findings: No rash.  Neurological:     Mental Status: He is alert and oriented to person, place, and time.  Psychiatric:        Behavior: Behavior normal.     ED Results / Procedures / Treatments   Labs (all labs ordered are listed, but only abnormal results are displayed) Labs Reviewed  BASIC METABOLIC PANEL - Abnormal; Notable for the following components:      Result Value   Glucose, Bld 121 (*)    All other components within normal limits  CBC - Abnormal; Notable for the following components:   WBC 12.7 (*)    All other components within normal limits  RESP PANEL BY RT-PCR (FLU A&B, COVID) ARPGX2  TSH  T4, FREE  TROPONIN I (HIGH SENSITIVITY)  TROPONIN I (HIGH SENSITIVITY)    EKG EKG Interpretation  Date/Time:  Sunday August 13 2022 02:34:20 EDT Ventricular Rate:  111 PR Interval:  190 QRS Duration: 98 QT Interval:  316 QTC Calculation: 429 R Axis:   54 Text Interpretation: Sinus tachycardia Otherwise normal ECG No significant change since last tracing Confirmed by Melene Plan [phone]) on 08/13/2022  4:39:17 AM  Radiology DG Chest 2 View  Result Date: 08/13/2022 CLINICAL DATA:  Initial evaluation for acute chest pain, palpitations. EXAM: CHEST - 2 VIEW COMPARISON:  Prior radiograph from 01/15/2014. FINDINGS: Cardiac and mediastinal silhouettes are stable, and remain within normal limits. Lungs are mildly hypoinflated with secondary mild bronchovascular crowding. No focal infiltrates. No pulmonary edema or pleural effusion. No pneumothorax. Visualized soft tissues and osseous structures are within normal limits. IMPRESSION: 1. Shallow lung inflation with secondary mild bronchovascular crowding. 2. No other active cardiopulmonary disease. Electronically Signed   By: Rise Mu M.D.   On: 08/13/2022 03:48    Procedures Procedures    Medications Ordered in ED Medications  sodium chloride 0.9 % bolus 1,000 mL  (0 mLs Intravenous Stopped 08/13/22 0600)  acetaminophen (TYLENOL) tablet 1,000 mg (1,000 mg Oral Given 08/13/22 5361)    ED Course/ Medical Decision Making/ A&P                           Medical Decision Making Amount and/or Complexity of Data Reviewed Labs: ordered. Radiology: ordered.  Risk OTC drugs.   46 yo M with a chief complaint of palpitations.  This been noticed just this evening.  Patient was noted incidentally to have a fever of 100.4.  Could be the cause of his palpitations.  He does not endorse any infectious symptoms.  Lab work with a mild leukocytosis.  Chest x-ray independently interpreted by me without obvious focal infiltrate.  Will send off COVID testing. Bolus of fluids, tylenol.   Tachycardia had resolved on repeat assessment.  Heart rate in the 90s.  COVID-negative 2 troponins negative.  We will send off thyroid studies for PCP review.  We will have him follow-up with his family doctor in the office.  6:53 AM:  I have discussed the diagnosis/risks/treatment options with the patient and friend .  Evaluation and diagnostic testing in the emergency department does not suggest an emergent condition requiring admission or immediate intervention beyond what has been performed at this time.  They will follow up with PCP. We also discussed returning to the ED immediately if new or worsening sx occur. We discussed the sx which are most concerning (e.g., sudden worsening pain, fever, inability to tolerate by mouth) that necessitate immediate return. Medications administered to the patient during their visit and any new prescriptions provided to the patient are listed below.  Medications given during this visit Medications  sodium chloride 0.9 % bolus 1,000 mL (0 mLs Intravenous Stopped 08/13/22 0600)  acetaminophen (TYLENOL) tablet 1,000 mg (1,000 mg Oral Given 08/13/22 4431)     The patient appears reasonably screen and/or stabilized for discharge and I doubt any other medical  condition or other [facility] requiring further screening, evaluation, or treatment in the ED at this time prior to discharge.          Final Clinical Impression(s) / ED Diagnoses Final diagnoses:  Palpitations  Sinus tachycardia  Fever in adult    Rx / DC Orders ED Discharge Orders     None         Melene Plan, DO 08/13/22 [phone]
